# Patient Record
Sex: Male | Born: 1960 | Race: White | Hispanic: No | Marital: Married | Smoking: Former smoker
Health system: Southern US, Community
[De-identification: ages and names within clinical notes are randomized; demographics above are authoritative.]

## PROBLEM LIST (undated history)

## (undated) DIAGNOSIS — Z8719 Personal history of other diseases of the digestive system: Secondary | ICD-10-CM

## (undated) DIAGNOSIS — I10 Essential (primary) hypertension: Secondary | ICD-10-CM

## (undated) DIAGNOSIS — E785 Hyperlipidemia, unspecified: Secondary | ICD-10-CM

## (undated) DIAGNOSIS — E119 Type 2 diabetes mellitus without complications: Secondary | ICD-10-CM

## (undated) HISTORY — PX: FRACTURE SURGERY: SHX138

## (undated) HISTORY — PX: RENAL BIOPSY: SHX156

---

## 1998-09-18 ENCOUNTER — Emergency Department (HOSPITAL_COMMUNITY): Admission: EM | Admit: 1998-09-18 | Discharge: 1998-09-18 | Payer: Self-pay | Admitting: Emergency Medicine

## 2005-07-01 ENCOUNTER — Inpatient Hospital Stay: Payer: Self-pay | Admitting: Specialist

## 2014-06-06 ENCOUNTER — Ambulatory Visit: Payer: Self-pay | Admitting: Unknown Physician Specialty

## 2015-12-18 DIAGNOSIS — Z8719 Personal history of other diseases of the digestive system: Secondary | ICD-10-CM

## 2015-12-18 HISTORY — DX: Personal history of other diseases of the digestive system: Z87.19

## 2015-12-20 ENCOUNTER — Encounter: Payer: Self-pay | Admitting: Emergency Medicine

## 2015-12-20 ENCOUNTER — Emergency Department
Admission: EM | Admit: 2015-12-20 | Discharge: 2015-12-20 | Disposition: A | Discharge status: Home / Self Care | Payer: Managed Care, Other (non HMO) | Attending: Emergency Medicine | Admitting: Emergency Medicine

## 2015-12-20 DIAGNOSIS — T171XXA Foreign body in nostril, initial encounter: Secondary | ICD-10-CM

## 2015-12-20 DIAGNOSIS — Y929 Unspecified place or not applicable: Secondary | ICD-10-CM | POA: Diagnosis not present

## 2015-12-20 DIAGNOSIS — Y999 Unspecified external cause status: Secondary | ICD-10-CM | POA: Insufficient documentation

## 2015-12-20 DIAGNOSIS — S0993XA Unspecified injury of face, initial encounter: Secondary | ICD-10-CM | POA: Diagnosis present

## 2015-12-20 DIAGNOSIS — Z23 Encounter for immunization: Secondary | ICD-10-CM | POA: Insufficient documentation

## 2015-12-20 DIAGNOSIS — W458XXA Other foreign body or object entering through skin, initial encounter: Secondary | ICD-10-CM | POA: Insufficient documentation

## 2015-12-20 DIAGNOSIS — Y939 Activity, unspecified: Secondary | ICD-10-CM | POA: Insufficient documentation

## 2015-12-20 MED ORDER — OXYCODONE-ACETAMINOPHEN 5-325 MG PO TABS
1.0000 | ORAL_TABLET | Freq: Once | ORAL | Status: AC
  Administered 2015-12-20: 1 via ORAL
  Filled 2015-12-20: qty 1

## 2015-12-20 MED ORDER — SULFAMETHOXAZOLE-TRIMETHOPRIM 800-160 MG PO TABS
1.0000 | ORAL_TABLET | Freq: Once | ORAL | Status: AC
  Administered 2015-12-20: 1 via ORAL
  Filled 2015-12-20: qty 1

## 2015-12-20 MED ORDER — TETANUS-DIPHTH-ACELL PERTUSSIS 5-2.5-18.5 LF-MCG/0.5 IM SUSP
0.5000 mL | Freq: Once | INTRAMUSCULAR | Status: AC
  Administered 2015-12-20: 0.5 mL via INTRAMUSCULAR
  Filled 2015-12-20: qty 0.5

## 2015-12-20 MED ORDER — SULFAMETHOXAZOLE-TRIMETHOPRIM 800-160 MG PO TABS: 1.0000 | ORAL_TABLET | Freq: Two times a day (BID) | ORAL | Status: DC

## 2015-12-20 MED ORDER — LIDOCAINE HCL (PF) 1 % IJ SOLN
INTRAMUSCULAR | Status: AC
  Filled 2015-12-20: qty 5

## 2015-12-20 MED ORDER — OXYCODONE-ACETAMINOPHEN 5-325 MG PO TABS: 1.0000 | ORAL_TABLET | Freq: Four times a day (QID) | ORAL | Status: DC | PRN

## 2015-12-20 NOTE — ED Notes (Signed)
Patient arrives to [facility] ED by POV with family with a fish hook in the right side of his nose

## 2015-12-20 NOTE — ED Provider Notes (Signed)
[facility] Emergency Department Provider Note  ____________________________________________  Time seen: Approximately 7:22 PM  I have reviewed the triage vital signs and the nursing notes.   HISTORY  Chief Complaint Facial Injury    HPI Jeremiah Chung is a 55 y.o. male patient arrived via POV with a fishhook embedded the right side of his nose. Patient denies any bleeding with this incident. Incident occurred approximately 2 hours ago. Patient states tetanus shot is not up-to-date. No palliative measures taken for this complaint. Patient rates his pain discomfort as 7/10. Patient described a pain as "achy".  History reviewed. No pertinent past medical history.  There are no active problems to display for this patient.   No past surgical history on file.  Current Outpatient Rx  Name  Route  Sig  Dispense  Refill  . oxyCODONE-acetaminophen (ROXICET) 5-325 MG tablet   Oral   Take 1 tablet by mouth every 6 (six) hours as needed for moderate pain.   12 tablet   0   . sulfamethoxazole-trimethoprim (BACTRIM DS,SEPTRA DS) 800-160 MG tablet   Oral   Take 1 tablet by mouth 2 (two) times daily.   20 tablet   0     Allergies Review of patient's allergies indicates not on file.  History reviewed. No pertinent family history.  Social History Social History  Substance Use Topics  . Smoking status: None  . Smokeless tobacco: None  . Alcohol Use: None    Review of Systems Constitutional: No fever/chills Eyes: No visual changes. ENT: No sore throat. Cardiovascular: Denies chest pain. Respiratory: Denies shortness of breath. Gastrointestinal: No abdominal pain.  No nausea, no vomiting.  No diarrhea.  No constipation. Genitourinary: Negative for dysuria. Musculoskeletal: Negative for back pain. Skin: Negative for rash. Foreign body right nostril Neurological: Negative for headaches, focal weakness or  numbness.    ____________________________________________   PHYSICAL EXAM:  VITAL SIGNS: ED Triage Vitals  Enc Vitals Group     BP 12/20/15 1830 167/98 mmHg     Pulse Rate 12/20/15 1830 73     Resp 12/20/15 1830 18     Temp 12/20/15 1830 97.8 F (36.6 C)     Temp Source 12/20/15 1830 Oral     SpO2 12/20/15 1830 98 %     Weight 12/20/15 1830 226 lb (102.513 kg)     Height 12/20/15 1830 5\' 10"  (1.778 m)     Head Cir --      Peak Flow --      Pain Score 12/20/15 1831 7     Pain Loc --      Pain Edu? --      Excl. in [place]? --     Constitutional: Alert and oriented. Well appearing and in no acute distress. Eyes: Conjunctivae are normal. PERRL. EOMI. Head: Atraumatic. Nose: No congestion/rhinnorhea. Foreign body right nostril Mouth/Throat: Mucous membranes are moist.  Oropharynx non-erythematous. Neck: No stridor.  No cervical spine tenderness to palpation. Hematological/Lymphatic/Immunilogical: No cervical lymphadenopathy. Cardiovascular: Normal rate, regular rhythm. Grossly normal heart sounds.  Good peripheral circulation. Respiratory: Normal respiratory effort.  No retractions. Lungs CTAB. Gastrointestinal: Soft and nontender. No distention. No abdominal bruits. No CVA tenderness. Musculoskeletal: No lower extremity tenderness nor edema.  No joint effusions. Neurologic:  Normal speech and language. No gross focal neurologic deficits are appreciated. No gait instability. Skin:  Skin is warm, dry and intact. No rash noted. Foreign body right nostril Psychiatric: Mood and affect are normal. Speech and behavior are  normal.  ____________________________________________   LABS (all labs ordered are listed, but only abnormal results are displayed)  Labs Reviewed - No data to display ____________________________________________  EKG   ____________________________________________  RADIOLOGY   ____________________________________________   PROCEDURES  Procedure(s)  performed: None  Critical Care performed: No  ____________________________________________   INITIAL IMPRESSION / ASSESSMENT AND PLAN / ED COURSE  Pertinent labs & imaging results that were available during my care of the patient were reviewed by me and considered in my medical decision making (see chart for details).  Foreign body right nostril. After local block fishhook was pushed through the skin and the bar was snapped off. The body to fishhook was backed out of the nostril. Patient given discharge care instructions. Patient given a prescription for Bactrim DS and Percocets. Patient is given a tetanus shot in the ED. Patient advised return by ER if his condition worsens. ____________________________________________   FINAL CLINICAL IMPRESSION(S) / ED DIAGNOSES  Final diagnoses:  Nasal foreign body, initial encounter      Sable Feil, PA-C 12/20/15 [place], MD 12/20/15 [phone]

## 2015-12-20 NOTE — Discharge Instructions (Signed)
Nasal Foreign Body A nasal foreign body is an object that is stuck in the nose. The object can make it hard to breathe or swallow. The object can also cause an infection. You need to get medical help right away. HOME CARE   Do not try to remove the object yourself.  Breathe through the mouth to avoid swallowing the object.  Keep small objects away from young children.  Tell your child not to put objects into his or her nose. Tell your child to get help from an adult right away if it happens again. GET HELP RIGHT AWAY IF:  There is trouble breathing.  There is trouble swallowing, more drooling, or new chest pain.  The nose starts bleeding.  Fluid keeps coming from the nose.  A fever, earache, or headache develops.  There is yellow-green fluid coming from the nose.  There is pain in the cheeks or around the eyes. MAKE SURE YOU:  Understand these instructions.  Will watch your condition.  Will get help right away if you are not doing well or get worse.   This information is not intended to replace advice given to you by your health care provider. Make sure you discuss any questions you have with your health care provider.   Document Released: 10/07/2004 Document Revised: 11/22/2011 Document Reviewed: 03/03/2015 Elsevier Interactive Patient Education [employer].

## 2015-12-20 NOTE — ED Notes (Signed)
Discharge instructions reviewed with patient. Patient verbalized understanding. Patient ambulated to lobby without difficulty.   

## 2016-02-19 ENCOUNTER — Encounter: Payer: Self-pay | Admitting: *Deleted

## 2016-02-20 ENCOUNTER — Ambulatory Visit: Payer: Managed Care, Other (non HMO) | Admitting: Anesthesiology

## 2016-02-20 ENCOUNTER — Encounter: Payer: Self-pay | Admitting: Anesthesiology

## 2016-02-20 ENCOUNTER — Encounter
Admission: RE | Disposition: A | Discharge status: Home / Self Care | Payer: Self-pay | Source: Ambulatory Visit | Attending: Unknown Physician Specialty

## 2016-02-20 ENCOUNTER — Ambulatory Visit
Admission: RE | Admit: 2016-02-20 | Discharge: 2016-02-20 | Disposition: A | Discharge status: Home / Self Care | Payer: Managed Care, Other (non HMO) | Source: Ambulatory Visit | Attending: Unknown Physician Specialty | Admitting: Unknown Physician Specialty

## 2016-02-20 DIAGNOSIS — K640 First degree hemorrhoids: Secondary | ICD-10-CM | POA: Diagnosis not present

## 2016-02-20 DIAGNOSIS — E785 Hyperlipidemia, unspecified: Secondary | ICD-10-CM | POA: Diagnosis not present

## 2016-02-20 DIAGNOSIS — Z87891 Personal history of nicotine dependence: Secondary | ICD-10-CM | POA: Diagnosis not present

## 2016-02-20 DIAGNOSIS — D125 Benign neoplasm of sigmoid colon: Secondary | ICD-10-CM | POA: Diagnosis not present

## 2016-02-20 DIAGNOSIS — Z79899 Other long term (current) drug therapy: Secondary | ICD-10-CM | POA: Diagnosis not present

## 2016-02-20 DIAGNOSIS — E119 Type 2 diabetes mellitus without complications: Secondary | ICD-10-CM | POA: Insufficient documentation

## 2016-02-20 DIAGNOSIS — Z9889 Other specified postprocedural states: Secondary | ICD-10-CM | POA: Diagnosis not present

## 2016-02-20 DIAGNOSIS — Z79891 Long term (current) use of opiate analgesic: Secondary | ICD-10-CM | POA: Diagnosis not present

## 2016-02-20 DIAGNOSIS — Z7984 Long term (current) use of oral hypoglycemic drugs: Secondary | ICD-10-CM | POA: Insufficient documentation

## 2016-02-20 DIAGNOSIS — K5730 Diverticulosis of large intestine without perforation or abscess without bleeding: Secondary | ICD-10-CM | POA: Diagnosis not present

## 2016-02-20 DIAGNOSIS — Z1211 Encounter for screening for malignant neoplasm of colon: Secondary | ICD-10-CM | POA: Insufficient documentation

## 2016-02-20 DIAGNOSIS — D124 Benign neoplasm of descending colon: Secondary | ICD-10-CM | POA: Diagnosis not present

## 2016-02-20 DIAGNOSIS — Z791 Long term (current) use of non-steroidal anti-inflammatories (NSAID): Secondary | ICD-10-CM | POA: Diagnosis not present

## 2016-02-20 DIAGNOSIS — D123 Benign neoplasm of transverse colon: Secondary | ICD-10-CM | POA: Insufficient documentation

## 2016-02-20 DIAGNOSIS — K621 Rectal polyp: Secondary | ICD-10-CM | POA: Insufficient documentation

## 2016-02-20 HISTORY — PX: COLONOSCOPY WITH PROPOFOL: SHX5780

## 2016-02-20 HISTORY — DX: Type 2 diabetes mellitus without complications: E11.9

## 2016-02-20 HISTORY — DX: Personal history of other diseases of the digestive system: Z87.19

## 2016-02-20 HISTORY — DX: Hyperlipidemia, unspecified: E78.5

## 2016-02-20 LAB — GLUCOSE, CAPILLARY: GLUCOSE-CAPILLARY: 131 mg/dL — AB (ref 65–99)

## 2016-02-20 SURGERY — COLONOSCOPY WITH PROPOFOL
Anesthesia: General

## 2016-02-20 MED ORDER — FENTANYL CITRATE (PF) 100 MCG/2ML IJ SOLN
INTRAMUSCULAR | Status: DC | PRN
  Administered 2016-02-20: 50 ug via INTRAVENOUS

## 2016-02-20 MED ORDER — LIDOCAINE HCL (PF) 2 % IJ SOLN
INTRAMUSCULAR | Status: DC | PRN
  Administered 2016-02-20: 50 mg

## 2016-02-20 MED ORDER — SODIUM CHLORIDE 0.9 % IV SOLN
INTRAVENOUS | Status: DC
  Administered 2016-02-20: 08:00:00 via INTRAVENOUS

## 2016-02-20 MED ORDER — PROPOFOL 500 MG/50ML IV EMUL
INTRAVENOUS | Status: DC | PRN
  Administered 2016-02-20: 100 ug/kg/min via INTRAVENOUS

## 2016-02-20 MED ORDER — MIDAZOLAM HCL 5 MG/5ML IJ SOLN
INTRAMUSCULAR | Status: DC | PRN
  Administered 2016-02-20 (×2): 1 mg via INTRAVENOUS

## 2016-02-20 MED ORDER — PROPOFOL 10 MG/ML IV BOLUS
INTRAVENOUS | Status: DC | PRN
Start: 2016-02-20 — End: 2016-02-20
  Administered 2016-02-20: 50 mg via INTRAVENOUS

## 2016-02-20 MED ORDER — SODIUM CHLORIDE 0.9 % IV SOLN
INTRAVENOUS | Status: DC
  Administered 2016-02-20: 1000 mL via INTRAVENOUS

## 2016-02-20 NOTE — Transfer of Care (Signed)
Immediate Anesthesia Transfer of Care Note  Patient: Jeremiah Chung  Procedure(s) Performed: Procedure(s): COLONOSCOPY WITH PROPOFOL (N/A)  Patient Location: PACU  Anesthesia Type:General  Level of Consciousness: sedated  Airway & Oxygen Therapy: Patient Spontanous Breathing  Post-op Assessment: Report given to RN and Post -op Vital signs reviewed and stable  Post vital signs: Reviewed and stable  Last Vitals:  Filed Vitals:   02/20/16 0707  BP: 152/90  Pulse: 68  Temp: 35.8 C  Resp: 16    Last Pain: There were no vitals filed for this visit.       Complications: No apparent anesthesia complications

## 2016-02-20 NOTE — Op Note (Signed)
[facility] Gastroenterology Patient Name: Jeremiah Chung Procedure Date: 02/20/2016 7:30 AM MRN: [id number] Account #: [id number] Date of Birth: Dec 08, 1960 Admit Type: Outpatient Age: 55 Room: [facility] ENDO ROOM 4 Gender: Male Note Status: Finalized Procedure:            Colonoscopy Indications:          Screening for colorectal malignant neoplasm Providers:            Manya Silvas, MD Referring MD:         Ramonita Lab, MD (Referring MD) Medicines:            Propofol per Anesthesia Complications:        No immediate complications. Procedure:            Pre-Anesthesia Assessment:                       - After reviewing the risks and benefits, the patient                        was deemed in satisfactory condition to undergo the                        procedure.                       After obtaining informed consent, the colonoscope was                        passed under direct vision. Throughout the procedure,                        the patient's blood pressure, pulse, and oxygen                        saturations were monitored continuously. The                        Colonoscope was introduced through the anus and                        advanced to the the cecum, identified by appendiceal                        orifice and ileocecal valve. The colonoscopy was                        somewhat difficult. The patient tolerated the procedure                        well. The quality of the bowel preparation was good. Findings:      Two sessile polyps were found in the transverse colon. The polyps were       small in size. These polyps were removed with a hot snare. Resection and       retrieval were complete.      A large polyp was found in the descending colon. The polyp was sessile.       The polyp was removed with a hot snare. Resection and retrieval were       complete. To prevent bleeding after the polypectomy, six hemostatic       clips were successfully  placed. There was no bleeding at the end of the       maneuver.      A medium polyp was found in the rectum. The polyp was sessile. The polyp       was removed with a hot snare. Resection and retrieval were complete.      A small polyp was found in the sigmoid colon. The polyp was sessile. The       polyp was removed with a hot snare. Resection and retrieval were       complete.      Internal hemorrhoids were found. The hemorrhoids were small and Grade I       (internal hemorrhoids that do not prolapse).      A few small-mouthed diverticula were found in the sigmoid colon and       descending colon.      Internal hemorrhoids were found. The hemorrhoids were small and Grade I       (internal hemorrhoids that do not prolapse). Impression:           - Two small polyps in the transverse colon, removed                        with a hot snare. Resected and retrieved.                       - One large polyp in the descending colon, removed with                        a hot snare. Resected and retrieved. Clips were placed.                       - One medium polyp in the rectum, removed with a hot                        snare. Resected and retrieved.                       - One small polyp in the sigmoid colon, removed with a                        hot snare. Resected and retrieved.                       - Internal hemorrhoids.                       - Diverticulosis in the sigmoid colon and in the                        descending colon.                       - Internal hemorrhoids. Recommendation:       - Await pathology results. Manya Silvas, MD 02/20/2016 8:22:10 AM This report has been signed electronically. Number of Addenda: 0 Note Initiated On: 02/20/2016 7:30 AM Scope Withdrawal Time: 0 hours 33 minutes 47 seconds  Total Procedure Duration: 0 hours 38 minutes 48 seconds       [facility]

## 2016-02-20 NOTE — H&P (Signed)
   Primary Care Physician:  Adin Hector, MD Primary Gastroenterologist:  Dr. Vira Agar  Pre-Procedure History & Physical: HPI:  Jeremiah Chung is a 55 y.o. male is here for an colonoscopy.   Past Medical History  Diagnosis Date  . Diabetes mellitus without complication ([place])   . Hyperlipidemia   . H/O diverticulitis of colon 12/18/2015    Past Surgical History  Procedure Laterality Date  . Fracture surgery    . Renal biopsy      Prior to Admission medications   Medication Sig Start Date End Date Taking? Authorizing Provider  fenofibrate 160 MG tablet Take 160 mg by mouth daily.   Yes Historical Provider, MD  glipiZIDE (GLUCOTROL) 5 MG tablet Take 5 mg by mouth daily before breakfast.   Yes Historical Provider, MD  HYDROcodone-acetaminophen (NORCO/VICODIN) 5-325 MG tablet Take 1 tablet by mouth every 6 (six) hours as needed for moderate pain.   Yes Historical Provider, MD  Linagliptin-Metformin HCl (JENTADUETO) 2.5-500 MG TABS Take 2.5-500 mg by mouth 2 (two) times daily.   Yes Historical Provider, MD  naproxen (NAPROSYN) 500 MG tablet Take 500 mg by mouth 2 (two) times daily with a meal.   Yes Historical Provider, MD  rosuvastatin (CRESTOR) 40 MG tablet Take 40 mg by mouth daily.   Yes Historical Provider, MD  oxyCODONE-acetaminophen (ROXICET) 5-325 MG tablet Take 1 tablet by mouth every 6 (six) hours as needed for moderate pain. 12/20/15   Sable Feil, PA-C  sulfamethoxazole-trimethoprim (BACTRIM DS,SEPTRA DS) 800-160 MG tablet Take 1 tablet by mouth 2 (two) times daily. 12/20/15   Sable Feil, PA-C    Allergies as of 02/06/2016  . (Not on File)    History reviewed. No pertinent family history.  Social History   Social History  . Marital Status: Married    Spouse Name: N/A  . Number of Children: N/A  . Years of Education: N/A   Occupational History  . Not on file.   Social History Main Topics  . Smoking status: Former Research scientist (life sciences)  . Smokeless tobacco: Never Used   . Alcohol Use: 1.8 oz/week    3 Cans of beer per week  . Drug Use: No  . Sexual Activity: Not on file   Other Topics Concern  . Not on file   Social History Narrative    Review of Systems: See HPI, otherwise negative ROS  Physical Exam: BP 152/90 mmHg  Pulse 68  Temp(Src) 96.4 F (35.8 C) (Tympanic)  Resp 16  Ht 5\' 10"  (1.778 m)  Wt 102.513 kg (226 lb)  BMI 32.43 kg/m2  SpO2 100% General:   Alert,  pleasant and cooperative in NAD Head:  Normocephalic and atraumatic. Neck:  Supple; no masses or thyromegaly. Lungs:  Clear throughout to auscultation.    Heart:  Regular rate and rhythm. Abdomen:  Soft, nontender and nondistended. Normal bowel sounds, without guarding, and without rebound.   Neurologic:  Alert and  oriented x4;  grossly normal neurologically.  Impression/Plan: Jeremiah Chung is here for an colonoscopy to be performed for screening colonoscopy  Risks, benefits, limitations, and alternatives regarding  colonoscopy have been reviewed with the patient.  Questions have been answered.  All parties agreeable.   Gaylyn Cheers, MD  02/20/2016, 7:14 AM

## 2016-02-20 NOTE — Anesthesia Preprocedure Evaluation (Signed)
Anesthesia Evaluation  Patient identified by MRN, date of birth, ID band Patient awake    Reviewed: Allergy & Precautions, H&P , NPO status , Patient's Chart, lab work & pertinent test results  History of Anesthesia Complications Negative for: history of anesthetic complications  Airway Mallampati: III  TM Distance: >3 FB Neck ROM: full    Dental  (+) Poor Dentition, Chipped   Pulmonary neg shortness of breath, Current Smoker,    Pulmonary exam normal breath sounds clear to auscultation       Cardiovascular Exercise Tolerance: Good (-) angina(-) Past MI and (-) DOE negative cardio ROS Normal cardiovascular exam Rhythm:regular Rate:Normal     Neuro/Psych negative neurological ROS  negative psych ROS   GI/Hepatic negative GI ROS, Neg liver ROS,   Endo/Other  diabetes, Type 2  Renal/GU negative Renal ROS  negative genitourinary   Musculoskeletal   Abdominal   Peds  Hematology negative hematology ROS (+)   Anesthesia Other Findings Past Medical History:   Diabetes mellitus without complication ([place])                 Hyperlipidemia                                               H/O diverticulitis of colon                     12/18/2015    Past Surgical History:   FRACTURE SURGERY                                             Signs and symptoms suggestive of sleep apnea   Requested that patient remove contact lenses to prevent corneal abrasion     Reproductive/Obstetrics negative OB ROS                             Anesthesia Physical Anesthesia Plan  ASA: III  Anesthesia Plan: General   Post-op Pain Management:    Induction:   Airway Management Planned:   Additional Equipment:   Intra-op Plan:   Post-operative Plan:   Informed Consent: I have reviewed the patients History and Physical, chart, labs and discussed the procedure including the risks, benefits and alternatives for  the proposed anesthesia with the patient or authorized representative who has indicated his/her understanding and acceptance.   Dental Advisory Given  Plan Discussed with: Anesthesiologist, CRNA and Surgeon  Anesthesia Plan Comments:         Anesthesia Quick Evaluation

## 2016-02-20 NOTE — Anesthesia Postprocedure Evaluation (Signed)
Anesthesia Post Note  Patient: Jeremiah Chung  Procedure(s) Performed: Procedure(s) (LRB): COLONOSCOPY WITH PROPOFOL (N/A)  Patient location during evaluation: Endoscopy Anesthesia Type: General Level of consciousness: awake and alert Pain management: pain level controlled Vital Signs Assessment: post-procedure vital signs reviewed and stable Respiratory status: spontaneous breathing, nonlabored ventilation, respiratory function stable and patient connected to nasal cannula oxygen Cardiovascular status: blood pressure returned to baseline and stable Postop Assessment: no signs of nausea or vomiting Anesthetic complications: no    Last Vitals:  Filed Vitals:   02/20/16 0707 02/20/16 0820  BP: 152/90 132/84  Pulse: 68 58  Temp: 35.8 C 35.8 C  Resp: 16 21    Last Pain: There were no vitals filed for this visit.               Precious Haws Mohamadou Maciver

## 2016-02-24 LAB — SURGICAL PATHOLOGY

## 2016-07-15 ENCOUNTER — Ambulatory Visit
Admission: EM | Admit: 2016-07-15 | Discharge: 2016-07-15 | Disposition: A | Discharge status: Home / Self Care | Payer: Managed Care, Other (non HMO) | Attending: Family Medicine | Admitting: Family Medicine

## 2016-07-15 ENCOUNTER — Ambulatory Visit (INDEPENDENT_AMBULATORY_CARE_PROVIDER_SITE_OTHER): Payer: Managed Care, Other (non HMO)

## 2016-07-15 ENCOUNTER — Encounter: Payer: Self-pay | Admitting: *Deleted

## 2016-07-15 DIAGNOSIS — M222X1 Patellofemoral disorders, right knee: Secondary | ICD-10-CM

## 2016-07-15 MED ORDER — MELOXICAM 15 MG PO TABS: 15.0000 mg | ORAL_TABLET | Freq: Every day | ORAL | 0 refills | Status: AC

## 2016-07-15 NOTE — ED Provider Notes (Signed)
CSN: [id number]     Arrival date & time 07/15/16  1654 History   First MD Initiated Contact with Patient 07/15/16 1846     Chief Complaint  Patient presents with  . Knee Pain   (Consider location/radiation/quality/duration/timing/severity/associated sxs/prior Treatment) HPI  This a 55 year old male who presents with right knee pain that he's had for the last week. He does not remember any injury recently. Is that over the last 2 days has been hurting much worse. Denies any locking or clicking or popping. The pain appears to be over the medial joint and also retropatellar. He had a meniscal injury to his left knee which show feels similar to what he is experiencing today. He has not noticed any swelling. Sates it hurts worse when he first stands from a seated position that he's been evaluated for a while after he finishes any long walking.        Past Medical History:  Diagnosis Date  . Diabetes mellitus without complication ([place])   . H/O diverticulitis of colon 12/18/2015  . Hyperlipidemia    Past Surgical History:  Procedure Laterality Date  . COLONOSCOPY WITH PROPOFOL N/A 02/20/2016   Procedure: COLONOSCOPY WITH PROPOFOL;  Surgeon: Manya Silvas, MD;  Location: [facility] ENDOSCOPY;  Service: Endoscopy;  Laterality: N/A;  . FRACTURE SURGERY    . RENAL BIOPSY     History reviewed. No pertinent family history. Social History  Substance Use Topics  . Smoking status: Former Research scientist (life sciences)  . Smokeless tobacco: Never Used  . Alcohol use 1.8 oz/week    3 Cans of beer per week    Review of Systems  Constitutional: Positive for activity change. Negative for chills, fatigue and fever.  Musculoskeletal: Positive for arthralgias and gait problem.  All other systems reviewed and are negative.   Allergies  Review of patient's allergies indicates no known allergies.  Home Medications   Prior to Admission medications   Medication Sig Start Date End Date Taking? Authorizing Provider   fenofibrate 160 MG tablet Take 160 mg by mouth daily.   Yes Historical Provider, MD  glipiZIDE (GLUCOTROL) 5 MG tablet Take 5 mg by mouth daily before breakfast.   Yes Historical Provider, MD  Linagliptin-Metformin HCl (JENTADUETO) 2.5-500 MG TABS Take 2.5-500 mg by mouth 2 (two) times daily.   Yes Historical Provider, MD  rosuvastatin (CRESTOR) 40 MG tablet Take 40 mg by mouth daily.   Yes Historical Provider, MD  meloxicam (MOBIC) 15 MG tablet Take 1 tablet (15 mg total) by mouth daily. 07/15/16   Lorin Picket, PA-C   Meds Ordered and Administered this Visit  Medications - No data to display  BP (!) 178/103 (BP Location: Left Arm)   Pulse 66   Temp 98.4 F (36.9 C) (Oral)   Resp 16   Ht 5\' 9"  (1.753 m)   Wt 215 lb (97.5 kg)   SpO2 100%   BMI 31.75 kg/m  No data found.   Physical Exam  Constitutional: He appears well-developed and well-nourished. No distress.  HENT:  Head: Normocephalic and atraumatic.  Eyes: EOM are normal. Pupils are equal, round, and reactive to light.  Neck: Normal range of motion. Neck supple.  Musculoskeletal:  Examination of the right knee shows the patient to have a antalgic gait in the right. He has good quadriceps control slightly soft quad on the right as compared to the left. There is no expressible effusion present. There is some tenderness to the retropatellar area. He does have a  tenderness along the medial joint line with her is a small swelling over the anterior portion. He does have slight laxity of the medial collateral ligament but there is definite endpoint present. Negative drawer sign anteriorly and posteriorly. He shows good range of motion of the knee. He is a negative McMurray sign.  Skin: He is not diaphoretic.  Nursing note and vitals reviewed.   Urgent Care Course   Clinical Course    Procedures (including critical care time)  Labs Review Labs Reviewed - No data to display  Imaging Review Dg Knee Complete 4 Views  Right  Result Date: 07/15/2016 CLINICAL DATA:  Gradual onset right knee pain and instability over the past week. EXAM: RIGHT KNEE - COMPLETE 4+ VIEW COMPARISON:  None. FINDINGS: Small posterior patellar spurs. Larger anterior patellar spur at the site of quadriceps tendon insertion. No effusion. IMPRESSION: No acute abnormality.  Mild patellofemoral degenerative changes. Electronically Signed   By: Claudie Revering M.D.   On: 07/15/2016 19:35     Visual Acuity Review  Right Eye Distance:   Left Eye Distance:   Bilateral Distance:    Right Eye Near:   Left Eye Near:    Bilateral Near:         MDM   1. Patellofemoral pain syndrome of right knee    New Prescriptions   MELOXICAM (MOBIC) 15 MG TABLET    Take 1 tablet (15 mg total) by mouth daily.  Plan: 1. Test/x-ray results and diagnosis reviewed with patient 2. rx as per orders; risks, benefits, potential side effects reviewed with patient 3. Recommend supportive treatment with Rest and symptom avoidance. Patient has a knee brace at home that he can use as necessary. He is not improved in 2 weeks have recommended that he follow-up with orthopedic surgeon for further evaluation. I also instructed him in isometric quadriceps strengthening exercises. 4. F/u prn if symptoms worsen or don't improve     JAYCOB RAMAKRISHNAN, PA-C 07/15/16 1955

## 2016-07-15 NOTE — ED Triage Notes (Signed)
Gradual onset right knee pain and instability over past week. Pain is to the medial aspect of right knee, pt states it feels as thoughhis knee will give way. No edema/ deformity, denies injury.

## 2016-08-02 ENCOUNTER — Other Ambulatory Visit: Payer: Self-pay | Admitting: Orthopedic Surgery

## 2016-08-02 DIAGNOSIS — M2391 Unspecified internal derangement of right knee: Secondary | ICD-10-CM

## 2016-08-02 DIAGNOSIS — M25561 Pain in right knee: Secondary | ICD-10-CM

## 2016-08-04 ENCOUNTER — Encounter: Payer: Self-pay | Admitting: *Deleted

## 2016-08-09 ENCOUNTER — Encounter: Payer: Self-pay | Admitting: *Deleted

## 2016-08-09 ENCOUNTER — Encounter
Admission: RE | Disposition: A | Discharge status: Home / Self Care | Payer: Self-pay | Source: Ambulatory Visit | Attending: Unknown Physician Specialty

## 2016-08-09 ENCOUNTER — Ambulatory Visit
Admission: RE | Admit: 2016-08-09 | Discharge: 2016-08-09 | Disposition: A | Discharge status: Home / Self Care | Payer: Managed Care, Other (non HMO) | Source: Ambulatory Visit | Attending: Unknown Physician Specialty | Admitting: Unknown Physician Specialty

## 2016-08-09 ENCOUNTER — Ambulatory Visit: Payer: Managed Care, Other (non HMO) | Admitting: Anesthesiology

## 2016-08-09 DIAGNOSIS — Z1211 Encounter for screening for malignant neoplasm of colon: Secondary | ICD-10-CM | POA: Insufficient documentation

## 2016-08-09 DIAGNOSIS — I10 Essential (primary) hypertension: Secondary | ICD-10-CM | POA: Insufficient documentation

## 2016-08-09 DIAGNOSIS — K635 Polyp of colon: Secondary | ICD-10-CM | POA: Insufficient documentation

## 2016-08-09 DIAGNOSIS — Z86010 Personal history of colonic polyps: Secondary | ICD-10-CM | POA: Insufficient documentation

## 2016-08-09 DIAGNOSIS — D123 Benign neoplasm of transverse colon: Secondary | ICD-10-CM | POA: Diagnosis not present

## 2016-08-09 DIAGNOSIS — Z87891 Personal history of nicotine dependence: Secondary | ICD-10-CM | POA: Diagnosis not present

## 2016-08-09 DIAGNOSIS — Z79899 Other long term (current) drug therapy: Secondary | ICD-10-CM | POA: Diagnosis not present

## 2016-08-09 DIAGNOSIS — E119 Type 2 diabetes mellitus without complications: Secondary | ICD-10-CM | POA: Insufficient documentation

## 2016-08-09 DIAGNOSIS — E785 Hyperlipidemia, unspecified: Secondary | ICD-10-CM | POA: Diagnosis not present

## 2016-08-09 DIAGNOSIS — K5730 Diverticulosis of large intestine without perforation or abscess without bleeding: Secondary | ICD-10-CM | POA: Insufficient documentation

## 2016-08-09 DIAGNOSIS — D128 Benign neoplasm of rectum: Secondary | ICD-10-CM | POA: Insufficient documentation

## 2016-08-09 DIAGNOSIS — Z7984 Long term (current) use of oral hypoglycemic drugs: Secondary | ICD-10-CM | POA: Diagnosis not present

## 2016-08-09 HISTORY — DX: Essential (primary) hypertension: I10

## 2016-08-09 HISTORY — PX: COLONOSCOPY WITH PROPOFOL: SHX5780

## 2016-08-09 LAB — GLUCOSE, CAPILLARY: GLUCOSE-CAPILLARY: 143 mg/dL — AB (ref 65–99)

## 2016-08-09 SURGERY — COLONOSCOPY WITH PROPOFOL
Anesthesia: General

## 2016-08-09 MED ORDER — LIDOCAINE 2% (20 MG/ML) 5 ML SYRINGE
INTRAMUSCULAR | Status: DC | PRN
  Administered 2016-08-09: 100 mg via INTRAVENOUS

## 2016-08-09 MED ORDER — PROPOFOL 10 MG/ML IV BOLUS
INTRAVENOUS | Status: DC | PRN
  Administered 2016-08-09: 40 mg via INTRAVENOUS
  Administered 2016-08-09: 100 mg via INTRAVENOUS

## 2016-08-09 MED ORDER — FENTANYL CITRATE (PF) 100 MCG/2ML IJ SOLN
INTRAMUSCULAR | Status: DC | PRN
  Administered 2016-08-09: 50 ug via INTRAVENOUS

## 2016-08-09 MED ORDER — MIDAZOLAM HCL 5 MG/5ML IJ SOLN
INTRAMUSCULAR | Status: DC | PRN
  Administered 2016-08-09: 1 mg via INTRAVENOUS

## 2016-08-09 MED ORDER — SODIUM CHLORIDE 0.9 % IV SOLN
INTRAVENOUS | Status: DC
  Administered 2016-08-09: 08:00:00 via INTRAVENOUS

## 2016-08-09 MED ORDER — PROPOFOL 500 MG/50ML IV EMUL
INTRAVENOUS | Status: DC | PRN
  Administered 2016-08-09: 160 ug/kg/min via INTRAVENOUS

## 2016-08-09 MED ORDER — SODIUM CHLORIDE 0.9 % IV SOLN: INTRAVENOUS | Status: DC

## 2016-08-09 MED ORDER — SODIUM CHLORIDE 0.9 % IV SOLN
INTRAVENOUS | Status: DC | PRN
Start: 2016-08-09 — End: 2016-08-09
  Administered 2016-08-09: 08:00:00 via INTRAVENOUS

## 2016-08-09 NOTE — Transfer of Care (Signed)
Immediate Anesthesia Transfer of Care Note  Patient: Jeremiah Chung  Procedure(s) Performed: Procedure(s): COLONOSCOPY WITH PROPOFOL (N/A)  Patient Location: PACU and Endoscopy Unit  Anesthesia Type:General  Level of Consciousness: awake, oriented and patient cooperative  Airway & Oxygen Therapy: Patient Spontanous Breathing and Patient connected to nasal cannula oxygen  Post-op Assessment: Report given to RN and Post -op Vital signs reviewed and stable  Post vital signs: Reviewed and stable  Last Vitals:  Vitals:   08/09/16 0720  BP: (!) 152/89  Pulse: 66  Resp: 18  Temp: 37.2 C    Last Pain:  Vitals:   08/09/16 0720  TempSrc: Tympanic         Complications: No apparent anesthesia complications

## 2016-08-09 NOTE — Anesthesia Postprocedure Evaluation (Signed)
Anesthesia Post Note  Patient: Jeremiah Chung  Procedure(s) Performed: Procedure(s) (LRB): COLONOSCOPY WITH PROPOFOL (N/A)  Patient location during evaluation: Endoscopy Anesthesia Type: General Level of consciousness: awake and alert Pain management: pain level controlled Vital Signs Assessment: post-procedure vital signs reviewed and stable Respiratory status: spontaneous breathing and respiratory function stable Cardiovascular status: stable Anesthetic complications: no    Last Vitals:  Vitals:   08/09/16 0814 08/09/16 0816  BP: 118/79 118/79  Pulse: 65 62  Resp: 19 20  Temp: (!) 36 C (!) 36 C    Last Pain:  Vitals:   08/09/16 0814  TempSrc: Tympanic                 KEPHART,[place]arn[place] K

## 2016-08-09 NOTE — Anesthesia Preprocedure Evaluation (Signed)
Anesthesia Evaluation  Patient identified by MRN, date of birth, ID band Patient awake    Reviewed: Allergy & Precautions, NPO status , Patient's Chart, lab work & pertinent test results  History of Anesthesia Complications Negative for: history of anesthetic complications  Airway Mallampati: III       Dental   Pulmonary neg pulmonary ROS, former smoker,           Cardiovascular hypertension, Pt. on medications negative cardio ROS       Neuro/Psych negative neurological ROS     GI/Hepatic negative GI ROS, Neg liver ROS,   Endo/Other  diabetes, Type 2, Oral Hypoglycemic Agents  Renal/GU negative Renal ROS     Musculoskeletal   Abdominal   Peds  Hematology   Anesthesia Other Findings   Reproductive/Obstetrics                             Anesthesia Physical Anesthesia Plan  ASA: III  Anesthesia Plan: General   Post-op Pain Management:    Induction: Intravenous  Airway Management Planned:   Additional Equipment:   Intra-op Plan:   Post-operative Plan:   Informed Consent: I have reviewed the patients History and Physical, chart, labs and discussed the procedure including the risks, benefits and alternatives for the proposed anesthesia with the patient or authorized representative who has indicated his/her understanding and acceptance.     Plan Discussed with:   Anesthesia Plan Comments:         Anesthesia Quick Evaluation

## 2016-08-09 NOTE — H&P (Signed)
   Primary Care Physician:  Adin Hector, MD Primary Gastroenterologist:  Dr. Vira Agar  Pre-Procedure History & Physical: HPI:  Jeremiah Chung is a 55 y.o. male is here for an colonoscopy.  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   Past Medical History:  Diagnosis Date  . Diabetes mellitus without complication ([facility])   . H/O diverticulitis of colon 12/18/2015  . Hyperlipidemia   . Hypertension     Past Surgical History:  Procedure Laterality Date  . COLONOSCOPY WITH PROPOFOL N/A 02/20/2016   Procedure: COLONOSCOPY WITH PROPOFOL;  Surgeon: Manya Silvas, MD;  Location: [facility] ENDOSCOPY;  Service: Endoscopy;  Laterality: N/A;  . FRACTURE SURGERY    . RENAL BIOPSY      Prior to Admission medications   Medication Sig Start Date End Date Taking? Authorizing Provider  fenofibrate 160 MG tablet Take 160 mg by mouth daily.   Yes Historical Provider, MD  glipiZIDE (GLUCOTROL) 5 MG tablet Take 5 mg by mouth daily before breakfast.   Yes Historical Provider, MD  Linagliptin-Metformin HCl (JENTADUETO) 2.5-500 MG TABS Take 2.5-500 mg by mouth 2 (two) times daily.   Yes Historical Provider, MD  rosuvastatin (CRESTOR) 40 MG tablet Take 40 mg by mouth daily.   Yes Historical Provider, MD  losartan (COZAAR) 25 MG tablet Take 25 mg by mouth daily.    Historical Provider, MD  meloxicam (MOBIC) 15 MG tablet Take 1 tablet (15 mg total) by mouth daily. Patient not taking: Reported on 08/09/2016 07/15/16   Lorin Picket, PA-C    Allergies as of 06/03/2016  . (No Known Allergies)    History reviewed. No pertinent family history.  Social History   Social History  . Marital status: Married    Spouse name: N/A  . Number of children: N/A  . Years of education: N/A   Occupational History  . Not on file.   Social History Main Topics  . Smoking status: Former Research scientist (life sciences)  . Smokeless tobacco: Never Used  . Alcohol use 1.8 oz/week    3 Cans of beer per week  . Drug  use: No  . Sexual activity: Not on file   Other Topics Concern  . Not on file   Social History Narrative  . No narrative on file    Review of Systems: See HPI, otherwise negative ROS  Physical Exam: BP (!) 152/89   Pulse 66   Temp 99 F (37.2 C) (Tympanic)   Resp 18   Ht 5\' 9"  (1.753 m)   Wt 97.5 kg (215 lb)   SpO2 94%   BMI 31.75 kg/m  General:   Alert,  pleasant and cooperative in NAD Head:  Normocephalic and atraumatic. Neck:  Supple; no masses or thyromegaly. Lungs:  Clear throughout to auscultation.    Heart:  Regular rate and rhythm. Abdomen:  Soft, nontender and nondistended. Normal bowel sounds, without guarding, and without rebound.   Neurologic:  Alert and  oriented x4;  grossly normal neurologically.  Impression/Plan: Jeremiah Chung is here for an colonoscopy to be performed for follow up polyp with high grade dysplasia  Risks, benefits, limitations, and alternatives regarding  colonoscopy have been reviewed with the patient.  Questions have been answered.  All parties agreeable.   Gaylyn Cheers, MD  08/09/2016, 7:40 AM

## 2016-08-09 NOTE — Op Note (Signed)
[facility] Gastroenterology Patient Name: Jeremiah Chung Procedure Date: 08/09/2016 7:40 AM MRN: [id number] Account #: [id number] Date of Birth: 10/05/1960 Admit Type: Outpatient Age: 55 Room: [facility] ENDO ROOM 4 Gender: Male Note Status: Finalized Procedure:            Colonoscopy Indications:          High risk colon cancer surveillance: Personal history                        of colonic polyps Providers:            Manya Silvas, MD Referring MD:         Ramonita Lab, MD (Referring MD) Medicines:            Propofol per Anesthesia Complications:        No immediate complications. Procedure:            Pre-Anesthesia Assessment:                       - After reviewing the risks and benefits, the patient                        was deemed in satisfactory condition to undergo the                        procedure.                       After obtaining informed consent, the colonoscope was                        passed under direct vision. Throughout the procedure,                        the patient's blood pressure, pulse, and oxygen                        saturations were monitored continuously. The                        Colonoscope was introduced through the anus and                        advanced to the the cecum, identified by appendiceal                        orifice and ileocecal valve. The colonoscopy was                        performed without difficulty. The patient tolerated the                        procedure well. The quality of the bowel preparation                        was good. Findings:      D      A diminutive polyp was found in the transverse colon. The polyp was       sessile. The polyp was removed with a jumbo cold forceps. Resection and       retrieval were complete.  A 7 mm polyp was found in the descending colon. The polyp was sessile.       The polyp was removed with a hot snare. Resection and retrieval were       complete.     A diminutive polyp was found in the descending colon. The polyp was       sessile. The polyp was removed with a jumbo cold forceps. Resection and       retrieval were complete.      A small polyp was found in the rectum. The polyp was sessile. The polyp       was removed with a hot snare. Resection and retrieval were complete. 2       tiny cold biopsy forceps used also on two minute polyps.      Multiple small-mouthed diverticula were found in the sigmoid colon and       descending colon. Impression:           - One diminutive polyp in the transverse colon, removed                        with a jumbo cold forceps. Resected and retrieved.                       - One 7 mm polyp in the descending colon, removed with                        a hot snare. Resected and retrieved.                       - One diminutive polyp in the descending colon, removed                        with a jumbo cold forceps. Resected and retrieved.                       - One small polyp in the rectum, removed with a hot                        snare. Resected and retrieved. Recommendation:       - Await pathology results. Procedure Code(s):    --- Professional ---                       [phone], Colonoscopy, flexible; with removal of tumor(s),                        polyp(s), or other lesion(s) by snare technique                       [id number], 16, Colonoscopy, flexible; with biopsy, single                        or multiple Diagnosis Code(s):    --- Professional ---                       Z86.010, Personal history of colonic polyps                       D12.3, Benign neoplasm of transverse colon (hepatic  flexure or splenic flexure)                       D12.4, Benign neoplasm of descending colon                       K62.1, Rectal polyp CPT copyright 2016 American Medical Association. All rights reserved. The codes documented in this report are preliminary and upon coder review may  be revised  to meet current compliance requirements. Manya Silvas, MD 08/09/2016 8:15:15 AM This report has been signed electronically. Number of Addenda: 0 Note Initiated On: 08/09/2016 7:40 AM Scope Withdrawal Time: 0 hours 13 minutes 18 seconds  Total Procedure Duration: 0 hours 18 minutes 31 seconds       [facility]

## 2016-08-10 ENCOUNTER — Ambulatory Visit
Admission: RE | Admit: 2016-08-10 | Discharge: 2016-08-10 | Disposition: A | Discharge status: Home / Self Care | Payer: Managed Care, Other (non HMO) | Source: Ambulatory Visit | Attending: Orthopedic Surgery | Admitting: Orthopedic Surgery

## 2016-08-10 ENCOUNTER — Encounter: Payer: Self-pay | Admitting: Unknown Physician Specialty

## 2016-08-10 DIAGNOSIS — M25561 Pain in right knee: Secondary | ICD-10-CM | POA: Diagnosis not present

## 2016-08-10 DIAGNOSIS — M25461 Effusion, right knee: Secondary | ICD-10-CM | POA: Diagnosis not present

## 2016-08-10 DIAGNOSIS — M1711 Unilateral primary osteoarthritis, right knee: Secondary | ICD-10-CM | POA: Insufficient documentation

## 2016-08-10 DIAGNOSIS — M2391 Unspecified internal derangement of right knee: Secondary | ICD-10-CM

## 2016-08-10 DIAGNOSIS — M238X1 Other internal derangements of right knee: Secondary | ICD-10-CM | POA: Diagnosis not present

## 2016-08-10 LAB — SURGICAL PATHOLOGY

## 2016-08-14 ENCOUNTER — Ambulatory Visit: Payer: Managed Care, Other (non HMO)

## 2018-03-13 IMAGING — MR MR KNEE*R* W/O CM
6 series · 35 of 40 positions shown · non-contrast
Comparison: 07/15/2016 radiographs

CLINICAL DATA: Medial right knee pain and swelling.  Instability.

EXAM:
MRI OF THE RIGHT KNEE WITHOUT CONTRAST
TECHNIQUE: Multiplanar, multisequence MR imaging of the knee was performed. No
intravenous contrast was administered.

[Series 3: PD fat-sat · axial · 3.0mm · 0.50mm/px · z∈[-67,+46]mm · 9 of 35 slices shown (1 of 4)]
[im 1/35]
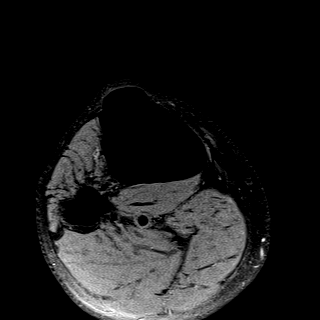
[im 5/35]
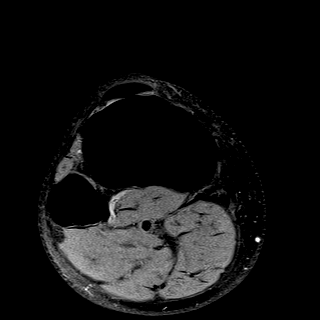
[im 9/35]
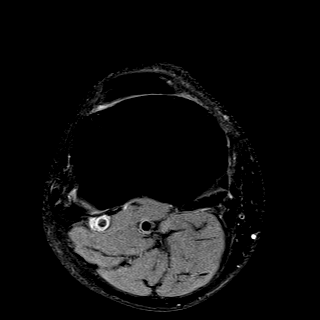
[im 13/35]
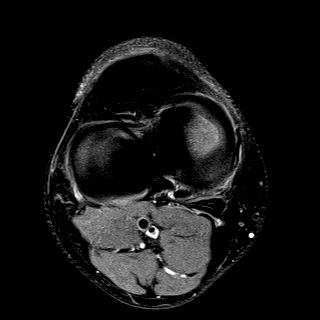
[im 18/35]
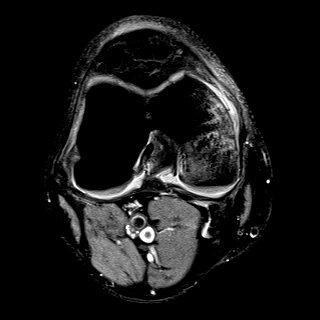
[im 22/35]
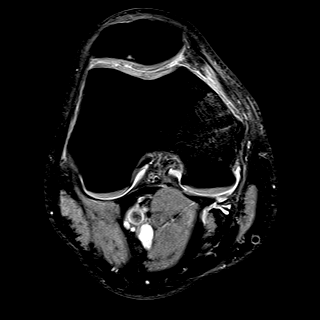
[im 26/35]
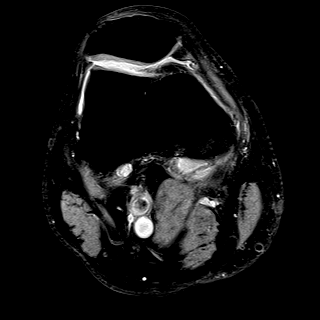
[im 30/35]
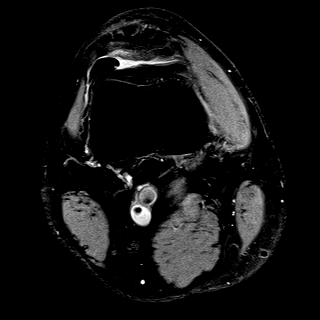
[im 35/35]
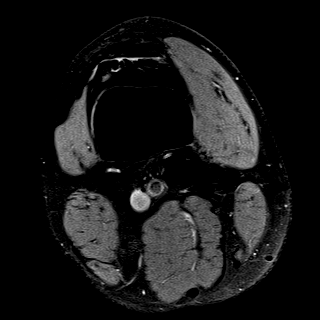

[Series 4: T1 · coronal · 3.0mm · 0.50mm/px · 2 of 29 slices shown]
[im 1/29]
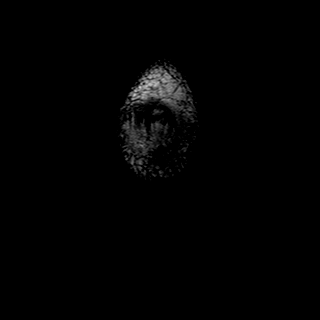
[im 5/29]
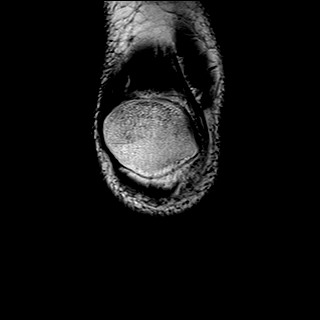

[Series 5: PD fat-sat · sagittal · 3.0mm · 0.50mm/px · 8 of 31 slices shown (2 of 4)]
[im 1/31]
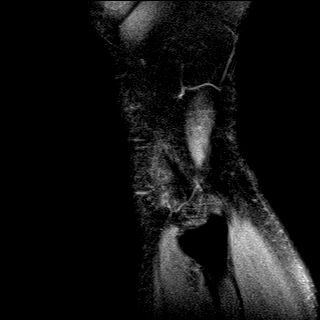
[im 5/31]
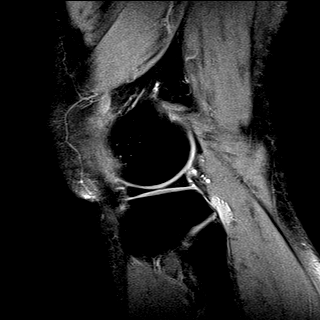
[im 9/31]
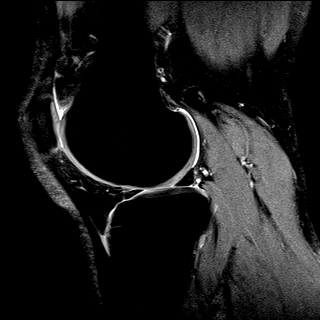
[im 13/31]
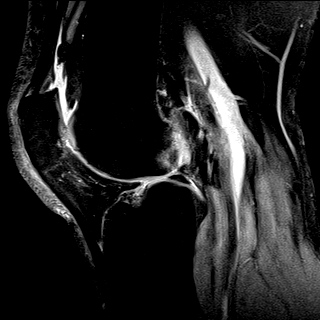
[im 18/31]
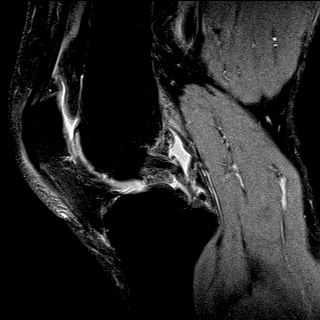
[im 22/31]
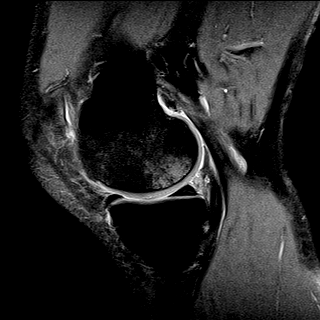
[im 26/31]
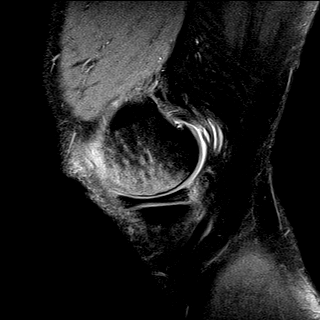
[im 31/31]
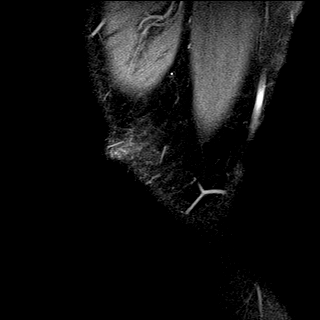

[Series 6: T2 fat-sat · coronal · 3.0mm · 0.31mm/px · 7 of 29 slices shown]
[im 1/29]
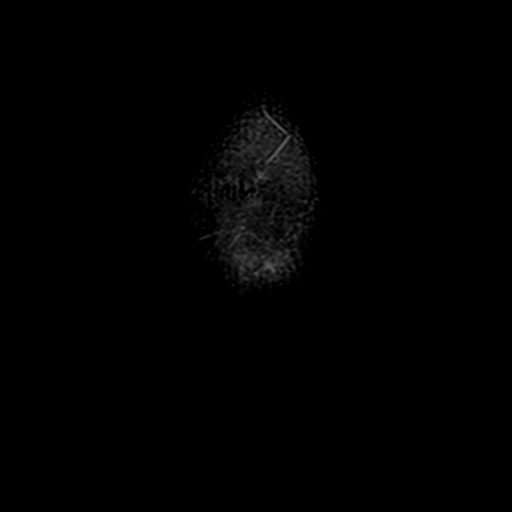
[im 5/29]
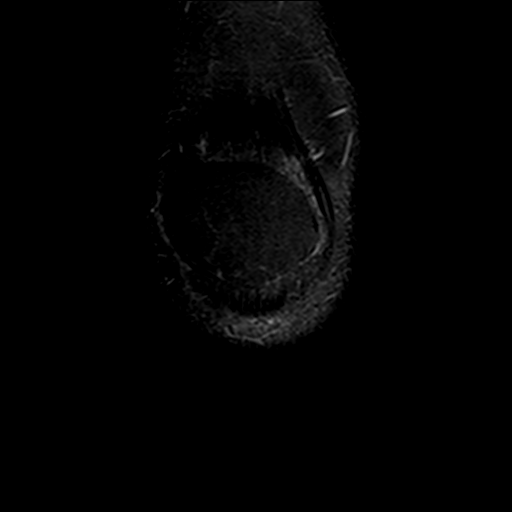
[im 10/29]
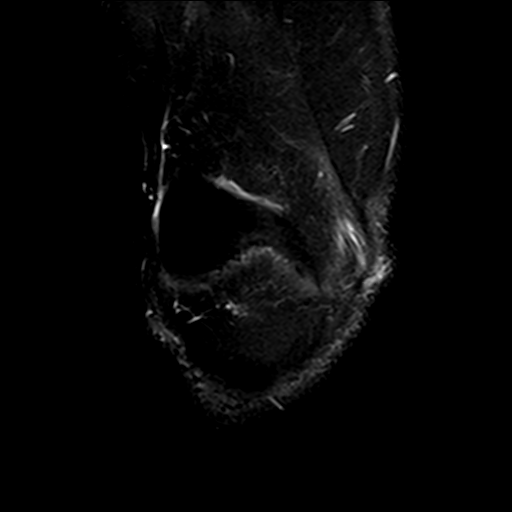
[im 15/29]
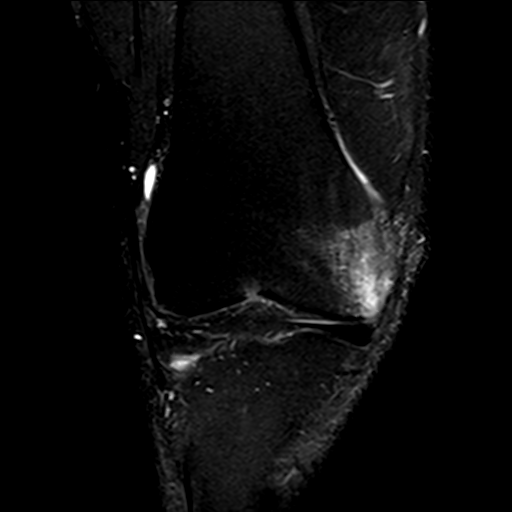
[im 19/29]
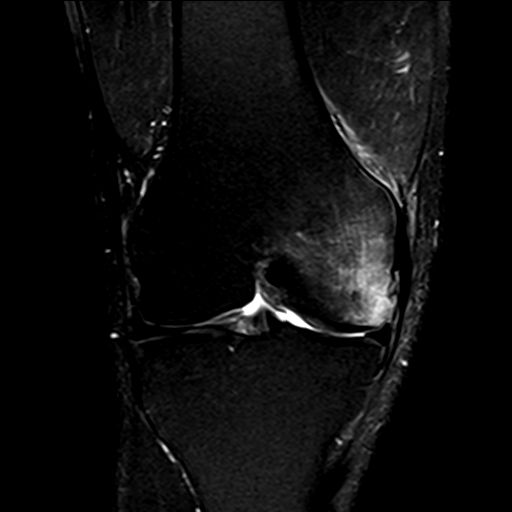
[im 24/29]
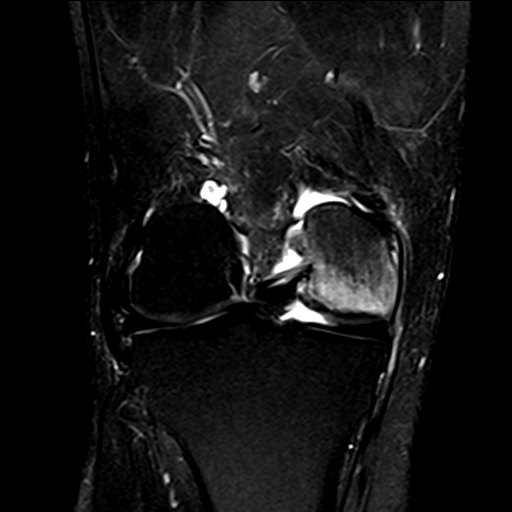
[im 29/29]
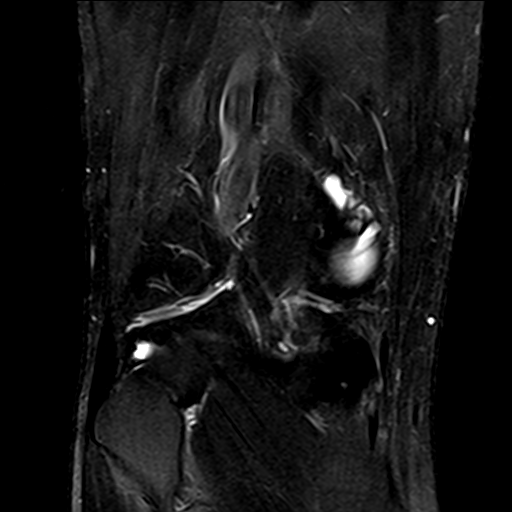

[Series 7: PD fat-sat · coronal · 3.0mm · 0.50mm/px · 7 of 29 slices shown (3 of 4)]
[im 1/29]
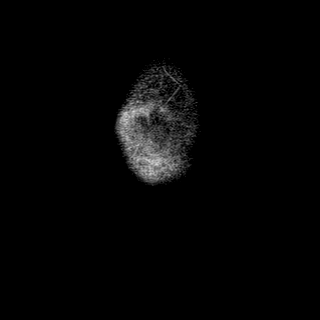
[im 5/29]
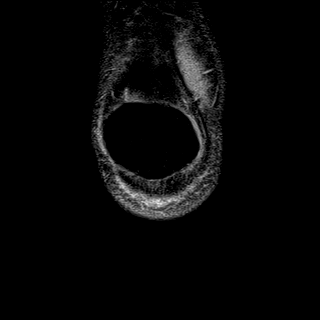
[im 10/29]
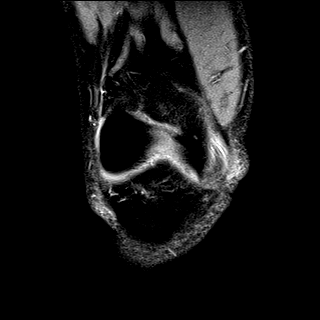
[im 15/29]
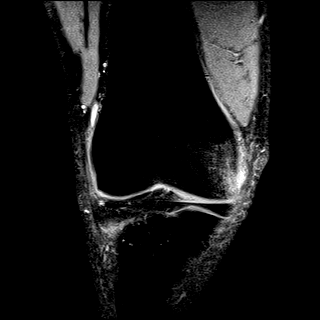
[im 19/29]
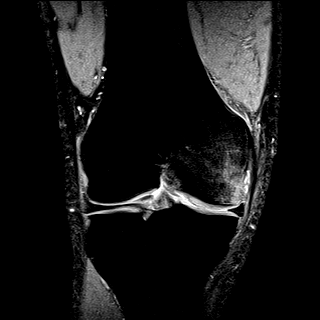
[im 24/29]
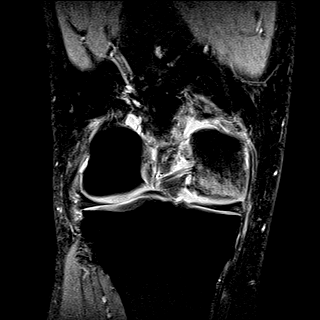
[im 29/29]
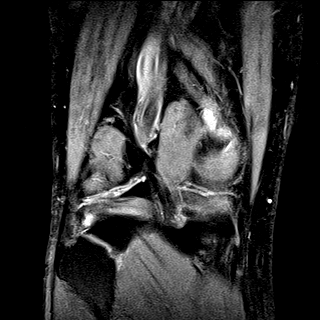

[Series 8: PD fat-sat · coronal · 2.0mm · 0.62mm/px · 2 of 7 slices shown (4 of 4)]
[im 1/7]
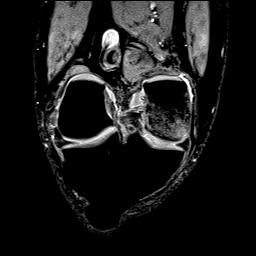
[im 7/7]
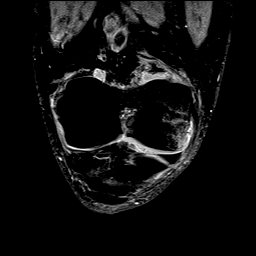

[35 of 40 positions shown; findings below may reference images not displayed]

FINDINGS: MENISCI

Medial meniscus:  Unremarkable

Lateral meniscus:  Unremarkable

LIGAMENTS

Cruciates:  Unremarkable

Collaterals: Accentuated T2 signal proximally in the fibular
collateral ligament.

CARTILAGE

Patellofemoral: Prominent chondral thinning along portions of the
medial patellar facet. Chondral irregularity and mild heterogeneity
along the lateral patellar facet. Small degenerative subcortical
focus of edema along the lateral patellar facet, image [DATE].
Moderate chondral thinning and mild chondral irregularity along the
femoral trochlear groove. Minimal marginal spurring.

Medial: Mild chondromalacia and mild chondral thinning with
prominent abnormal marrow edema in the medial femoral condyle. No
definite associated fracture at this time.

Lateral:  Unremarkable

Joint: Small knee effusion. 4 mm filling defect in the popliteus
recess, suspicious for a chondral or free osteochondral fragment.

Popliteal Fossa:  Small Baker's cyst.

Extensor Mechanism: Edema tracks around the medial patellar
retinaculum, probably incidental, less likely due to sprain.

Bones: Edema in the medial femoral condyle is noted above, most
confluent subcortically.

Other: No supplemental non-categorized findings.
IMPRESSION: 1. Prominent subcortical marrow edema in the medial compartment,
especially posteromedially. This could be from an incipient stress
fracture or could be from transient bone marrow edema syndrome.
2. Osteoarthritis most severe in the patellofemoral joint.
3. Accentuated T2 signal proximally in the fibular collateral
ligament could represent a mild sprain.
4. Small knee effusion with small Baker's cyst.
5. 4 mm filling defect in the popliteus recess, suspicious for a
chondral or free osteochondral fragment.
6. Mild edema tracks adjacent to the MCL. This can be incidental but
in the appropriate clinical circumstance could represent grade 1
sprain.

## 2019-07-14 ENCOUNTER — Ambulatory Visit: Admit: 2019-07-14 | Payer: Managed Care, Other (non HMO) | Admitting: Surgery

## 2019-07-14 SURGERY — EXCISION, LESION, SCALP
Anesthesia: General

## 2022-11-12 ENCOUNTER — Ambulatory Visit (INDEPENDENT_AMBULATORY_CARE_PROVIDER_SITE_OTHER): Payer: Managed Care, Other (non HMO)

## 2022-11-12 DIAGNOSIS — K5730 Diverticulosis of large intestine without perforation or abscess without bleeding: Secondary | ICD-10-CM | POA: Diagnosis not present

## 2022-11-12 DIAGNOSIS — Z86010 Personal history of colonic polyps: Secondary | ICD-10-CM | POA: Diagnosis present

## 2022-11-12 DIAGNOSIS — K640 First degree hemorrhoids: Secondary | ICD-10-CM | POA: Diagnosis not present

## 2024-05-01 ENCOUNTER — Encounter: Payer: Self-pay | Admitting: Emergency Medicine

## 2024-05-01 ENCOUNTER — Ambulatory Visit
Admission: EM | Admit: 2024-05-01 | Discharge: 2024-05-01 | Disposition: A | Discharge status: Home / Self Care | Attending: Emergency Medicine | Admitting: Emergency Medicine

## 2024-05-01 DIAGNOSIS — J069 Acute upper respiratory infection, unspecified: Secondary | ICD-10-CM | POA: Insufficient documentation

## 2024-05-01 LAB — GROUP A STREP BY PCR: Group A Strep by PCR: NOT DETECTED

## 2024-05-01 MED ORDER — PROMETHAZINE-DM 6.25-15 MG/5ML PO SYRP: 5.0000 mL | ORAL_SOLUTION | Freq: Four times a day (QID) | ORAL | 0 refills | Status: AC | PRN

## 2024-05-01 MED ORDER — BENZONATATE 100 MG PO CAPS: 200.0000 mg | ORAL_CAPSULE | Freq: Three times a day (TID) | ORAL | 0 refills | Status: AC

## 2024-05-01 MED ORDER — IPRATROPIUM BROMIDE 0.06 % NA SOLN: 2.0000 | Freq: Four times a day (QID) | NASAL | 12 refills | Status: AC

## 2024-05-01 MED ORDER — AMOXICILLIN-POT CLAVULANATE 875-125 MG PO TABS: 1.0000 | ORAL_TABLET | Freq: Two times a day (BID) | ORAL | 0 refills | Status: AC

## 2024-05-01 NOTE — Discharge Instructions (Signed)
 Your strep test today was negative but your exam does reveal that you have an upper respiratory tract infection which I believe is what is causing your symptoms.  Take the Augmentin , 875 mg twice daily with food, for 7 days for treatment of your upper respiratory tract infection.  Use over-the-counter Tylenol  and/or ibuprofen Corda the pack instructions as needed for any fever or pain.    Use the Atrovent  nasal spray, 2 squirts in each nostril every 6 hours, as needed for runny nose and postnasal drip.  Use the Tessalon  Perles every 8 hours during the day.  Take them with a small sip of water.  They may give you some numbness to the base of your tongue or a metallic taste in your mouth, this is normal.  Use the Promethazine  DM cough syrup at bedtime for cough and congestion.  It will make you drowsy so do not take it during the day.  Return for reevaluation or see your primary care provider for any new or worsening symptoms.

## 2024-05-01 NOTE — ED Provider Notes (Signed)
 [facility]    CSN: [id number] Arrival date & time: 05/01/24  0935      History   Chief Complaint Chief Complaint  Patient presents with   Sore Throat   Nasal Congestion   Headache   Fever   Cough    HPI Jeremiah Chung is a 63 y.o. male.   HPI  63 year old male with past medical history significant for hypertension, hyperlipidemia, diverticulosis, and diabetes presents for evaluation of respiratory symptoms that started a week ago.  He is reporting fever with a Tmax of 100, headache, nasal congestion, sore throat, and a nonproductive cough.  Yesterday morning at work he had an episode of nausea and vomiting but is not had any since.  This morning he did develop some diarrhea.  He denies any nasal discharge, shortness breath, wheezing, ear pain, known sick contacts, or recent travel out of the state or country.  Past Medical History:  Diagnosis Date   Diabetes mellitus without complication ([facility])    H/O diverticulitis of colon 12/18/2015   Hyperlipidemia    Hypertension     There are no active problems to display for this patient.   Past Surgical History:  Procedure Laterality Date   COLONOSCOPY WITH PROPOFOL  N/A 02/20/2016   Procedure: COLONOSCOPY WITH PROPOFOL ;  Surgeon: Lamar ONEIDA Holmes, MD;  Location: [facility] ENDOSCOPY;  Service: Endoscopy;  Laterality: N/A;   COLONOSCOPY WITH PROPOFOL  N/A 08/09/2016   Procedure: COLONOSCOPY WITH PROPOFOL ;  Surgeon: Lamar ONEIDA Holmes, MD;  Location: [facility] ENDOSCOPY;  Service: Endoscopy;  Laterality: N/A;   FRACTURE SURGERY     RENAL BIOPSY         Home Medications    Prior to Admission medications   Medication Sig Start Date End Date Taking? Authorizing Provider  amoxicillin -clavulanate (AUGMENTIN ) 875-125 MG tablet Take 1 tablet by mouth every 12 (twelve) hours for 7 days. 05/01/24 05/08/24 Yes Bernardino Ditch, NP  benzonatate  (TESSALON ) 100 MG capsule Take 2 capsules (200 mg total) by mouth every 8 (eight) hours. 05/01/24   Yes Bernardino Ditch, NP  fenofibrate 160 MG tablet Take 160 mg by mouth daily.   Yes [provider]  glipiZIDE (GLUCOTROL) 5 MG tablet Take 5 mg by mouth daily before breakfast.   Yes [provider]  ipratropium (ATROVENT ) 0.06 % nasal spray Place 2 sprays into both nostrils 4 (four) times daily. 05/01/24  Yes Bernardino Ditch, NP  losartan (COZAAR) 25 MG tablet Take 25 mg by mouth daily.   Yes [provider]  promethazine -dextromethorphan (PROMETHAZINE -DM) 6.25-15 MG/5ML syrup Take 5 mLs by mouth 4 (four) times daily as needed. 05/01/24  Yes Bernardino Ditch, NP  rosuvastatin (CRESTOR) 40 MG tablet Take 40 mg by mouth daily.   Yes [provider]  RYBELSUS 14 MG TABS Take 14 mg by mouth. 03/09/24  Yes [provider]  meloxicam  (MOBIC ) 15 MG tablet Take 1 tablet (15 mg total) by mouth daily. Patient not taking: Reported on 08/09/2016 07/15/16   Lacinda Elsie SQUIBB, PA-C  pantoprazole (PROTONIX) 40 MG tablet Take 40 mg by mouth daily.    [provider]    Family History History reviewed. No pertinent family history.  Social History Social History   Tobacco Use   Smoking status: Former   Smokeless tobacco: Never  Vaping Use   Vaping status: Never Used  Substance Use Topics   Alcohol use: Yes    Alcohol/week: 3.0 standard drinks of alcohol    Types: 3 Cans of beer  per week   Drug use: No     Allergies   Empagliflozin, Meloxicam , Metformin, and Tramadol   Review of Systems Review of Systems  Constitutional:  Positive for fever.  HENT:  Positive for congestion and sore throat. Negative for ear pain and rhinorrhea.   Respiratory:  Positive for cough. Negative for shortness of breath and wheezing.   Gastrointestinal:  Positive for diarrhea, nausea and vomiting.  Neurological:  Positive for headaches.     Physical Exam Triage Vital Signs ED Triage Vitals  Encounter Vitals Group     BP      Girls Systolic BP Percentile      Girls  Diastolic BP Percentile      Boys Systolic BP Percentile      Boys Diastolic BP Percentile      Pulse      Resp      Temp      Temp src      SpO2      Weight      Height      Head Circumference      Peak Flow      Pain Score      Pain Loc      Pain Education      Exclude from Growth Chart    No data found.  Updated Vital Signs BP (!) 165/80 (BP Location: Right Arm)   Pulse 71   Temp 98.6 F (37 C) (Oral)   Resp 16   Wt 204 lb (92.5 kg)   SpO2 96%   BMI 30.13 kg/m   Visual Acuity Right Eye Distance:   Left Eye Distance:   Bilateral Distance:    Right Eye Near:   Left Eye Near:    Bilateral Near:     Physical Exam Vitals and nursing note reviewed.  Constitutional:      Appearance: Normal appearance. He is not ill-appearing.  HENT:     Head: Normocephalic and atraumatic.     Right Ear: Tympanic membrane, ear canal and external ear normal. There is no impacted cerumen.     Left Ear: Tympanic membrane, ear canal and external ear normal. There is no impacted cerumen.     Nose: Congestion and rhinorrhea present.     Comments: Nasal mucosa is edematous, mildly erythematous, with scant clear discharge in both naris.    Mouth/Throat:     Mouth: Mucous membranes are moist.     Pharynx: Oropharynx is clear. Posterior oropharyngeal erythema present. No oropharyngeal exudate.     Comments: Tonsillar region 1+ edematous with erythema but no appreciable exudate.  Posterior pharynx also demonstrates erythema with clear postnasal drip. Cardiovascular:     Rate and Rhythm: Normal rate and regular rhythm.     Pulses: Normal pulses.     Heart sounds: Normal heart sounds. No murmur heard.    No friction rub. No gallop.  Pulmonary:     Effort: Pulmonary effort is normal.     Breath sounds: Normal breath sounds. No wheezing, rhonchi or rales.  Musculoskeletal:     Cervical back: Normal range of motion and neck supple. No tenderness.  Lymphadenopathy:     Cervical: No cervical  adenopathy.  Skin:    General: Skin is warm and dry.     Capillary Refill: Capillary refill takes less than 2 seconds.     Findings: No rash.  Neurological:     General: No focal deficit present.     Mental Status: He is  alert and oriented to person, place, and time.      UC Treatments / Results  Labs (all labs ordered are listed, but only abnormal results are displayed) Labs Reviewed  GROUP A STREP BY PCR    EKG   Radiology No results found.  Procedures Procedures (including critical care time)  Medications Ordered in UC Medications - No data to display  Initial Impression / Assessment and Plan / UC Course  I have reviewed the triage vital signs and the nursing notes.  Pertinent labs & imaging results that were available during my care of the patient were reviewed by me and considered in my medical decision making (see chart for details).   Patient is a nontoxic-appearing 63 year old male presenting for evaluation 1 week with respiratory symptoms as outlined HPI above.  He was sent home from work yesterday because he developed nausea and vomiting.  The nausea and vomiting have resolved but this morning he did develop some diarrhea.  He does not recall eating anything that tasted suspect and he has not been around anyone else with similar symptoms.  His physical exam does reveal inflammation of the upper respiratory tract as evidenced by inflamed nasal mucosa with scant clear nasal discharge.  He also is edematous and erythematous tonsillar pillars without appreciable exudate.  No cervical lymphadenopathy.  Cardiopulmonary exam reveals, sounds in all fields.  Differential diagnose include COVID, influenza, URI, strep pharyngitis.  Given the fact that he has had symptoms for a week I will not test him for COVID or influenza at this time but I will order a strep PCR.  Strep PCR is negative.  I will discharge patient with a diagnosis of URI with cough and congestion.  Due to the  fact the patient has been experiencing symptoms for a week I do feel a trial of antibiotics is warranted.  I will discharge him home on Augmentin  875 twice daily with food for 7 days.  Atrovent  nasal spray for nasal congestion.  Tessalon  Perles to prevent symptom consult for cough and congestion.   Final Clinical Impressions(s) / UC Diagnoses   Final diagnoses:  URI with cough and congestion     Discharge Instructions      Your strep test today was negative but your exam does reveal that you have an upper respiratory tract infection which I believe is what is causing your symptoms.  Take the Augmentin , 875 mg twice daily with food, for 7 days for treatment of your upper respiratory tract infection.  Use over-the-counter Tylenol  and/or ibuprofen Corda the pack instructions as needed for any fever or pain.    Use the Atrovent  nasal spray, 2 squirts in each nostril every 6 hours, as needed for runny nose and postnasal drip.  Use the Tessalon  Perles every 8 hours during the day.  Take them with a small sip of water.  They may give you some numbness to the base of your tongue or a metallic taste in your mouth, this is normal.  Use the Promethazine  DM cough syrup at bedtime for cough and congestion.  It will make you drowsy so do not take it during the day.  Return for reevaluation or see your primary care provider for any new or worsening symptoms.      ED Prescriptions     Medication Sig Dispense Auth. Provider   amoxicillin -clavulanate (AUGMENTIN ) 875-125 MG tablet Take 1 tablet by mouth every 12 (twelve) hours for 7 days. 14 tablet Bernardino Ditch, NP   benzonatate  (  TESSALON ) 100 MG capsule Take 2 capsules (200 mg total) by mouth every 8 (eight) hours. 21 capsule Bernardino Ditch, NP   ipratropium (ATROVENT ) 0.06 % nasal spray Place 2 sprays into both nostrils 4 (four) times daily. 15 mL Bernardino Ditch, NP   promethazine -dextromethorphan (PROMETHAZINE -DM) 6.25-15 MG/5ML syrup Take 5 mLs by  mouth 4 (four) times daily as needed. 118 mL Bernardino Ditch, NP      PDMP not reviewed this encounter.   Bernardino Ditch, NP 05/01/24 1034

## 2024-05-01 NOTE — ED Triage Notes (Signed)
 Pt presents with a cough, runny nose, headache, fever and sore throat x 1 week. Pt states his symptoms became worse yesterday. He has taken OTC cold medication w/o relief. He vomited once yesterday.

## 2024-05-02 ENCOUNTER — Ambulatory Visit: Payer: Self-pay
